# Patient Record
Sex: Female | Born: 1970 | Race: White | Hispanic: No | Marital: Married | Smoking: Never smoker
Health system: Southern US, Community
[De-identification: ages and names within clinical notes are randomized; demographics above are authoritative.]

## PROBLEM LIST (undated history)

## (undated) DIAGNOSIS — Z862 Personal history of diseases of the blood and blood-forming organs and certain disorders involving the immune mechanism: Secondary | ICD-10-CM

## (undated) DIAGNOSIS — N200 Calculus of kidney: Secondary | ICD-10-CM

## (undated) DIAGNOSIS — I319 Disease of pericardium, unspecified: Secondary | ICD-10-CM

---

## 2021-11-25 ENCOUNTER — Emergency Department (HOSPITAL_BASED_OUTPATIENT_CLINIC_OR_DEPARTMENT_OTHER): Payer: No Typology Code available for payment source

## 2021-11-25 ENCOUNTER — Other Ambulatory Visit: Payer: Self-pay

## 2021-11-25 ENCOUNTER — Emergency Department (HOSPITAL_BASED_OUTPATIENT_CLINIC_OR_DEPARTMENT_OTHER)
Admission: EM | Admit: 2021-11-25 | Discharge: 2021-11-25 | Disposition: A | Discharge status: Home / Self Care | Payer: No Typology Code available for payment source | Attending: Emergency Medicine | Admitting: Emergency Medicine

## 2021-11-25 ENCOUNTER — Encounter (HOSPITAL_BASED_OUTPATIENT_CLINIC_OR_DEPARTMENT_OTHER): Payer: Self-pay | Admitting: Emergency Medicine

## 2021-11-25 DIAGNOSIS — R197 Diarrhea, unspecified: Secondary | ICD-10-CM | POA: Insufficient documentation

## 2021-11-25 DIAGNOSIS — R109 Unspecified abdominal pain: Secondary | ICD-10-CM | POA: Diagnosis present

## 2021-11-25 DIAGNOSIS — R079 Chest pain, unspecified: Secondary | ICD-10-CM | POA: Insufficient documentation

## 2021-11-25 DIAGNOSIS — N390 Urinary tract infection, site not specified: Secondary | ICD-10-CM | POA: Insufficient documentation

## 2021-11-25 DIAGNOSIS — K529 Noninfective gastroenteritis and colitis, unspecified: Secondary | ICD-10-CM

## 2021-11-25 DIAGNOSIS — R072 Precordial pain: Secondary | ICD-10-CM

## 2021-11-25 HISTORY — DX: Disease of pericardium, unspecified: I31.9

## 2021-11-25 HISTORY — DX: Calculus of kidney: N20.0

## 2021-11-25 HISTORY — DX: Personal history of diseases of the blood and blood-forming organs and certain disorders involving the immune mechanism: Z86.2

## 2021-11-25 LAB — CBC WITH DIFFERENTIAL/PLATELET
Abs Immature Granulocytes: 0.02 10*3/uL (ref 0.00–0.07)
Basophils Absolute: 0 10*3/uL (ref 0.0–0.1)
Basophils Relative: 0 %
Eosinophils Absolute: 0 10*3/uL (ref 0.0–0.5)
Eosinophils Relative: 0 %
HCT: 39.5 % (ref 36.0–46.0)
Hemoglobin: 13.2 g/dL (ref 12.0–15.0)
Immature Granulocytes: 0 %
Lymphocytes Relative: 12 %
Lymphs Abs: 1.1 10*3/uL (ref 0.7–4.0)
MCH: 29.3 pg (ref 26.0–34.0)
MCHC: 33.4 g/dL (ref 30.0–36.0)
MCV: 87.6 fL (ref 80.0–100.0)
Monocytes Absolute: 0.7 10*3/uL (ref 0.1–1.0)
Monocytes Relative: 8 %
Neutro Abs: 6.8 10*3/uL (ref 1.7–7.7)
Neutrophils Relative %: 80 %
Platelets: 130 10*3/uL — ABNORMAL LOW (ref 150–400)
RBC: 4.51 MIL/uL (ref 3.87–5.11)
RDW: 12.9 % (ref 11.5–15.5)
WBC: 8.6 10*3/uL (ref 4.0–10.5)
nRBC: 0 % (ref 0.0–0.2)

## 2021-11-25 LAB — COMPREHENSIVE METABOLIC PANEL
ALT: 18 U/L (ref 0–44)
AST: 16 U/L (ref 15–41)
Albumin: 4.3 g/dL (ref 3.5–5.0)
Alkaline Phosphatase: 81 U/L (ref 38–126)
Anion gap: 12 (ref 5–15)
BUN: 6 mg/dL (ref 6–20)
CO2: 22 mmol/L (ref 22–32)
Calcium: 9.5 mg/dL (ref 8.9–10.3)
Chloride: 102 mmol/L (ref 98–111)
Creatinine, Ser: 0.79 mg/dL (ref 0.44–1.00)
GFR, Estimated: >60 mL/min (ref >60)
Glucose, Bld: 124 mg/dL — ABNORMAL HIGH (ref 70–99)
Potassium: 3.9 mmol/L (ref 3.5–5.1)
Sodium: 136 mmol/L (ref 135–145)
Total Bilirubin: 0.4 mg/dL (ref 0.3–1.2)
Total Protein: 7.5 g/dL (ref 6.5–8.1)

## 2021-11-25 LAB — URINALYSIS, ROUTINE W REFLEX MICROSCOPIC
Bilirubin Urine: NEGATIVE
Glucose, UA: NEGATIVE mg/dL
Hgb urine dipstick: NEGATIVE
Ketones, ur: NEGATIVE mg/dL
Leukocytes,Ua: NEGATIVE
Nitrite: NEGATIVE
Protein, ur: 30 mg/dL — AB
Specific Gravity, Urine: 1.028 (ref 1.005–1.030)
pH: 5.5 (ref 5.0–8.0)

## 2021-11-25 LAB — PREGNANCY, URINE: Preg Test, Ur: NEGATIVE

## 2021-11-25 LAB — TROPONIN I (HIGH SENSITIVITY): Troponin I (High Sensitivity): 3 ng/L (ref <18)

## 2021-11-25 LAB — LIPASE, BLOOD: Lipase: <10 U/L — ABNORMAL LOW (ref 11–51)

## 2021-11-25 MED ORDER — SODIUM CHLORIDE 0.9 % IV BOLUS
500.0000 mL | Freq: Once | INTRAVENOUS | Status: AC
Start: 2021-11-25 — End: 2021-11-25
  Administered 2021-11-25: 500 mL via INTRAVENOUS

## 2021-11-25 MED ORDER — SODIUM CHLORIDE 0.9 % IV SOLN
1.0000 g | Freq: Once | INTRAVENOUS | Status: AC
  Administered 2021-11-25: 1 g via INTRAVENOUS
  Filled 2021-11-25: qty 10

## 2021-11-25 MED ORDER — DICYCLOMINE HCL 20 MG PO TABS: 20.0000 mg | ORAL_TABLET | Freq: Two times a day (BID) | ORAL | 0 refills | Status: AC

## 2021-11-25 MED ORDER — FENTANYL CITRATE PF 50 MCG/ML IJ SOSY
50.0000 ug | PREFILLED_SYRINGE | Freq: Once | INTRAMUSCULAR | Status: AC
  Administered 2021-11-25: 50 ug via INTRAVENOUS
  Filled 2021-11-25: qty 1

## 2021-11-25 MED ORDER — AMOXICILLIN-POT CLAVULANATE 875-125 MG PO TABS: 1.0000 | ORAL_TABLET | Freq: Two times a day (BID) | ORAL | 0 refills | Status: DC

## 2021-11-25 MED ORDER — IOHEXOL 350 MG/ML SOLN
100.0000 mL | Freq: Once | INTRAVENOUS | Status: AC | PRN
  Administered 2021-11-25: 100 mL via INTRAVENOUS

## 2021-11-25 MED ORDER — ALUM & MAG HYDROXIDE-SIMETH 200-200-20 MG/5ML PO SUSP
30.0000 mL | Freq: Once | ORAL | Status: AC
  Administered 2021-11-25: 30 mL via ORAL
  Filled 2021-11-25: qty 30

## 2021-11-25 MED ORDER — METRONIDAZOLE 500 MG PO TABS: 500.0000 mg | ORAL_TABLET | Freq: Three times a day (TID) | ORAL | 0 refills | Status: AC

## 2021-11-25 MED ORDER — CEPHALEXIN 500 MG PO CAPS: 500.0000 mg | ORAL_CAPSULE | Freq: Four times a day (QID) | ORAL | 0 refills | Status: DC

## 2021-11-25 MED ORDER — ONDANSETRON HCL 4 MG/2ML IJ SOLN
4.0000 mg | Freq: Once | INTRAMUSCULAR | Status: AC
  Administered 2021-11-25: 4 mg via INTRAVENOUS
  Filled 2021-11-25: qty 2

## 2021-11-25 MED ORDER — KETOROLAC TROMETHAMINE 30 MG/ML IJ SOLN
30.0000 mg | Freq: Once | INTRAMUSCULAR | Status: AC
  Administered 2021-11-25: 30 mg via INTRAVENOUS
  Filled 2021-11-25: qty 1

## 2021-11-25 NOTE — ED Notes (Signed)
Pts medical history and allergies updated via pt questionnaire. EDP Palumbo informed.

## 2021-11-25 NOTE — ED Triage Notes (Signed)
Pt c/o generalized chest pain, generalized abdominal pain, and lower back pain ongoing for 2 days. Initially nauseous was not feeling well, increased urinary frequency, and now hurting all over. States "I cant tell where it is." Endorses pain all over.

## 2021-11-25 NOTE — ED Provider Notes (Signed)
[facility] EMERGENCY DEPT Provider Note   CSN: [id number] Arrival date & time: 11/25/21  3903     History  Chief Complaint  Patient presents with   Chest Pain   Abdominal Pain    Marilyn Garcia is a 51 y.o. female.  The history is provided by the patient.  Chest Pain Chest pain location: generalized. Pain quality: dull   Radiates to: also has abdominal pain and low back pain and diarrhea. Pain severity:  Moderate Onset quality:  Gradual Duration:  2 days Timing:  Constant Progression:  Unchanged Relieved by:  Nothing Worsened by:  Nothing Ineffective treatments: a dose of dayquil. Associated symptoms: abdominal pain and nausea   Associated symptoms: no fever, no shortness of breath and no vomiting   Abdominal pain:    Location:  Generalized   Severity:  Severe   Duration:  2 days   Timing:  Constant   Progression:  Unchanged   Chronicity:  New Risk factors: not female and no surgery   Abdominal Pain Associated symptoms: chest pain and nausea   Associated symptoms: no fever, no shortness of breath and no vomiting   Patient with all over chest and abdominal pain and low back pain.  "I can't tell where it is".       Home Medications Prior to Admission medications   Not on File      Allergies    Patient has no allergy information on record.    Review of Systems   Review of Systems  Constitutional:  Negative for fever.  HENT:  Negative for facial swelling.   Respiratory:  Negative for shortness of breath, wheezing and stridor.   Cardiovascular:  Positive for chest pain.  Gastrointestinal:  Positive for abdominal pain and nausea. Negative for vomiting.  Genitourinary:  Positive for frequency.  All other systems reviewed and are negative.   Physical Exam Updated Vital Signs BP 123/78   Pulse 80   Temp 99 F (37.2 C) (Oral)   Resp 15   Ht 5\' 5"  (1.651 m)   Wt 97.5 kg   SpO2 95%   BMI 35.78 kg/m  Physical Exam Vitals and nursing  note reviewed. Exam conducted with a chaperone present.  Constitutional:      General: She is not in acute distress.    Appearance: Normal appearance. She is well-developed. She is not diaphoretic.  HENT:     Head: Normocephalic and atraumatic.     Nose: Nose normal.     Mouth/Throat:     Mouth: Mucous membranes are moist.  Eyes:     Pupils: Pupils are equal, round, and reactive to light.  Cardiovascular:     Rate and Rhythm: Normal rate and regular rhythm.     Pulses: Normal pulses.     Heart sounds: Normal heart sounds.  Pulmonary:     Effort: Pulmonary effort is normal. No respiratory distress.     Breath sounds: Normal breath sounds.  Abdominal:     General: There is no distension.     Palpations: Abdomen is soft.     Tenderness: There is no abdominal tenderness. There is no guarding or rebound.     Comments: Gassy throughout   Genitourinary:    Vagina: No vaginal discharge.  Musculoskeletal:        General: Normal range of motion.     Cervical back: Neck supple.  Skin:    General: Skin is warm and dry.     Capillary Refill: Capillary  refill takes less than 2 seconds.     Findings: No erythema or rash.  Neurological:     General: No focal deficit present.     Mental Status: She is alert and oriented to person, place, and time.     Deep Tendon Reflexes: Reflexes normal.  Psychiatric:        Mood and Affect: Mood normal.        Behavior: Behavior normal.     ED Results / Procedures / Treatments   Labs (all labs ordered are listed, but only abnormal results are displayed) Results for orders placed or performed during the hospital encounter of 11/25/21  CBC with Differential  Result Value Ref Range   WBC 8.6 4.0 - 10.5 K/uL   RBC 4.51 3.87 - 5.11 MIL/uL   Hemoglobin 13.2 12.0 - 15.0 g/dL   HCT 32.9 51.8 - 84.1 %   MCV 87.6 80.0 - 100.0 fL   MCH 29.3 26.0 - 34.0 pg   MCHC 33.4 30.0 - 36.0 g/dL   RDW 66.0 63.0 - 16.0 %   Platelets 130 (L) 150 - 400 K/uL   nRBC  0.0 0.0 - 0.2 %   Neutrophils Relative % 80 %   Neutro Abs 6.8 1.7 - 7.7 K/uL   Lymphocytes Relative 12 %   Lymphs Abs 1.1 0.7 - 4.0 K/uL   Monocytes Relative 8 %   Monocytes Absolute 0.7 0.1 - 1.0 K/uL   Eosinophils Relative 0 %   Eosinophils Absolute 0.0 0.0 - 0.5 K/uL   Basophils Relative 0 %   Basophils Absolute 0.0 0.0 - 0.1 K/uL   Immature Granulocytes 0 %   Abs Immature Granulocytes 0.02 0.00 - 0.07 K/uL  Pregnancy, urine  Result Value Ref Range   Preg Test, Ur NEGATIVE NEGATIVE  Comprehensive metabolic panel  Result Value Ref Range   Sodium 136 135 - 145 mmol/L   Potassium 3.9 3.5 - 5.1 mmol/L   Chloride 102 98 - 111 mmol/L   CO2 22 22 - 32 mmol/L   Glucose, Bld 124 (H) 70 - 99 mg/dL   BUN 6 6 - 20 mg/dL   Creatinine, Ser 1.09 0.44 - 1.00 mg/dL   Calcium 9.5 8.9 - 32.3 mg/dL   Total Protein 7.5 6.5 - 8.1 g/dL   Albumin 4.3 3.5 - 5.0 g/dL   AST 16 15 - 41 U/L   ALT 18 0 - 44 U/L   Alkaline Phosphatase 81 38 - 126 U/L   Total Bilirubin 0.4 0.3 - 1.2 mg/dL   GFR, Estimated >55 >73 mL/min   Anion gap 12 5 - 15  Urinalysis, Routine w reflex microscopic Urine, Clean Catch  Result Value Ref Range   Color, Urine YELLOW YELLOW   APPearance HAZY (A) CLEAR   Specific Gravity, Urine 1.028 1.005 - 1.030   pH 5.5 5.0 - 8.0   Glucose, UA NEGATIVE NEGATIVE mg/dL   Hgb urine dipstick NEGATIVE NEGATIVE   Bilirubin Urine NEGATIVE NEGATIVE   Ketones, ur NEGATIVE NEGATIVE mg/dL   Protein, ur 30 (A) NEGATIVE mg/dL   Nitrite NEGATIVE NEGATIVE   Leukocytes,Ua NEGATIVE NEGATIVE   RBC / HPF 0-5 0 - 5 RBC/hpf   WBC, UA 0-5 0 - 5 WBC/hpf   Bacteria, UA MANY (A) NONE SEEN   Squamous Epithelial / LPF 11-20 0 - 5   Mucus PRESENT    Hyaline Casts, UA PRESENT   Lipase, blood  Result Value Ref Range   Lipase <10 (L)  11 - 51 U/L  Troponin I (High Sensitivity)  Result Value Ref Range   Troponin I (High Sensitivity) 3 <18 ng/L   DG Chest Portable 1 View  Result Date:  11/25/2021 CLINICAL DATA:  Chest pain for 2 days EXAM: PORTABLE CHEST 1 VIEW COMPARISON:  None Available. FINDINGS: The heart size and mediastinal contours are within normal limits. Both lungs are clear. The visualized skeletal structures are unremarkable. IMPRESSION: No active disease. Electronically Signed   By: Alcide Clever M.D.   On: 11/25/2021 03:48     DG Chest Portable 1 View  Result Date: 11/25/2021 CLINICAL DATA:  Chest pain for 2 days EXAM: PORTABLE CHEST 1 VIEW COMPARISON:  None Available. FINDINGS: The heart size and mediastinal contours are within normal limits. Both lungs are clear. The visualized skeletal structures are unremarkable. IMPRESSION: No active disease. Electronically Signed   By: Alcide Clever M.D.   On: 11/25/2021 03:48     EKG EKG Interpretation  Date/Time:  Wednesday November 25 2021 03:29:40 EDT Ventricular Rate:  82 PR Interval:  157 QRS Duration: 102 QT Interval:  373 QTC Calculation: 436 R Axis:   25 Text Interpretation: Sinus rhythm Low voltage, precordial leads Confirmed by Nicanor Alcon, Allaina Brotzman ([id number]) on 11/25/2021 3:32:45 AM  Radiology DG Chest Portable 1 View  Result Date: 11/25/2021 CLINICAL DATA:  Chest pain for 2 days EXAM: PORTABLE CHEST 1 VIEW COMPARISON:  None Available. FINDINGS: The heart size and mediastinal contours are within normal limits. Both lungs are clear. The visualized skeletal structures are unremarkable. IMPRESSION: No active disease. Electronically Signed   By: Alcide Clever M.D.   On: 11/25/2021 03:48    Procedures Procedures    Medications Ordered in ED Medications  fentaNYL (SUBLIMAZE) injection 50 mcg (has no administration in time range)  cefTRIAXone (ROCEPHIN) 1 g in sodium chloride 0.9 % 100 mL IVPB (has no administration in time range)  sodium chloride 0.9 % bolus 500 mL (has no administration in time range)  ondansetron (ZOFRAN) injection 4 mg (4 mg Intravenous Given 11/25/21 0353)  alum & mag hydroxide-simeth (MAALOX/MYLANTA)  200-200-20 MG/5ML suspension 30 mL (30 mLs Oral Given 11/25/21 0353)     ED Course/ Medical Decision Making/ A&P                           Medical Decision Making Chest, abdomen and low back pain x 2 days with diarrhea   Problems Addressed: Urinary tract infection without hematuria, site unspecified: acute illness or injury    Details: rocephin in the ED and keflex RX for 7 days  Amount and/or Complexity of Data Reviewed Labs: ordered.    Details: All labs reviewed: urine is consistent with UTI, treating.  Normal white count 8.6, normal hemoglobin 13.2, normal platelet count. pregnancy test is negative. Lipase is normal troponin is normal.  Chemistry panel and LFTs are normal Radiology: ordered and independent interpretation performed.    Details: negative CXR by me.  No dissection by me on CTA ECG/medicine tests: ordered and independent interpretation performed. Decision-making details documented in ED Course.  Risk OTC drugs. Prescription drug management. Risk Details: No signs of sepsis on exam, or vitals or labs.  Stable for discharge with close follow up.  Given time course one troponin is sufficient to exclude ACS.  Heart score is 1 very low risk for MACE.  No dissection. Colitis and UTI.  Oral antibiotics and antispasmodics prescribed.  Will have patient follow up  with GI as an outpatient for colonoscopy as directed on CT    Final Clinical Impression(s) / ED Diagnoses Final diagnoses:  None   Return for intractable cough, coughing up blood, fevers > 100.4 unrelieved by medication, shortness of breath, intractable vomiting, chest pain, shortness of breath, weakness, numbness, changes in speech, facial asymmetry, abdominal pain, passing out, Inability to tolerate liquids or food, cough, altered mental status or any concerns. No signs of systemic illness or infection. The patient is nontoxic-appearing on exam and vital signs are within normal limits.  I have reviewed the triage  vital signs and the nursing notes. Pertinent labs & imaging results that were available during my care of the patient were reviewed by me and considered in my medical decision making (see chart for details). After history, exam, and medical workup I feel the patient has been appropriately medically screened and is safe for discharge home. Pertinent diagnoses were discussed with the patient. Patient was given return precautions.        Rx / DC Orders ED Discharge Orders     None         Terez Montee, MD 11/25/21 [phone]

## 2021-11-25 NOTE — ED Notes (Signed)
Patient transported to CT 

## 2021-11-25 NOTE — ED Notes (Signed)
Pt verbalizes understanding of discharge instructions. Opportunity for questioning and answers were provided. Pt discharged from ED to home with husband.    

## 2023-06-12 IMAGING — DX DG CHEST 1V PORT
1 series · 1 of 1 positions shown · non-contrast
Comparison: None Available.

CLINICAL DATA: Chest pain for 2 days

EXAM:
PORTABLE CHEST 1 VIEW

[chest ap]
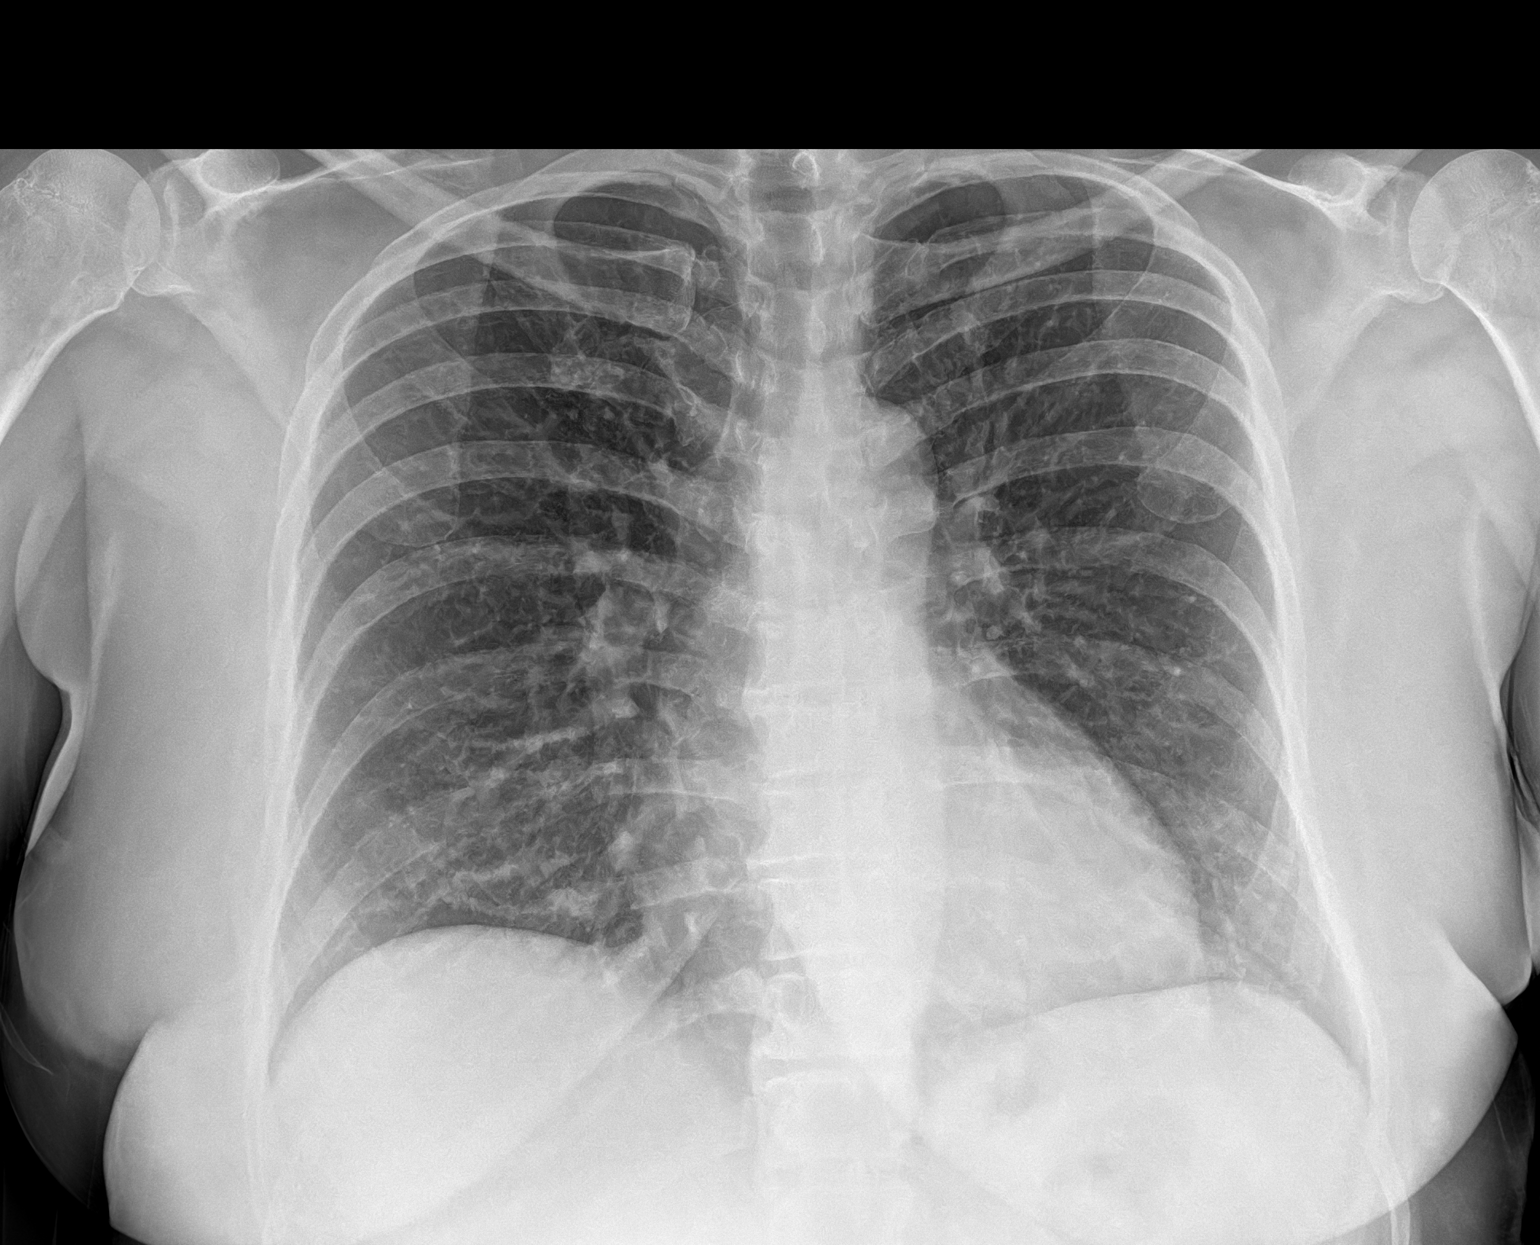

[1 of 1 positions shown; findings below may reference images not displayed]

FINDINGS: The heart size and mediastinal contours are within normal limits.
Both lungs are clear. The visualized skeletal structures are
unremarkable.
IMPRESSION: No active disease.

## 2023-06-12 IMAGING — CT CT ANGIO CHEST-ABD-PELV FOR DISSECTION W/ AND WO/W CM
2 of 7 series · 15 of 46 positions shown, 17 images · IV contrast (APPLIED)
Comparison: None Available.

CLINICAL DATA: Lower back pain with generalized abdominal pain for
2 days history of ITP and pericarditis

EXAM:
CT ANGIOGRAPHY CHEST, ABDOMEN AND PELVIS
TECHNIQUE: Non-contrast CT of the chest was initially obtained.

[Series 6: axial arterial · axial · arterial · 0.90mm/px · z∈[+811,+1423]mm · 12 of 342 slices shown, 14 images]
[im 18/342  soft-tissue]
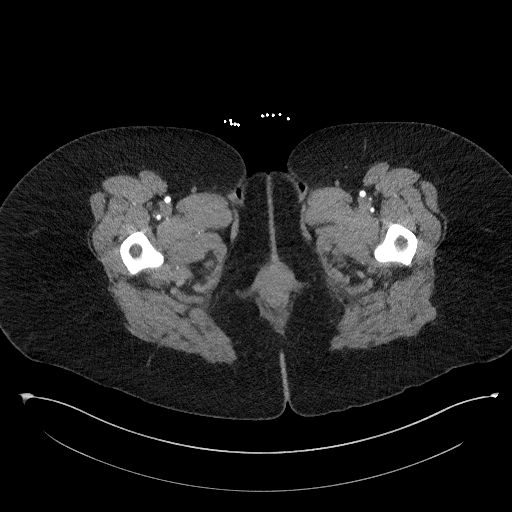
[im 18/342  bone]
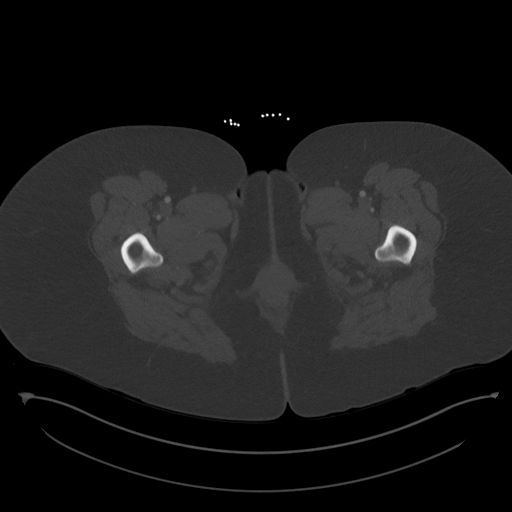
[im 54/342  soft-tissue]
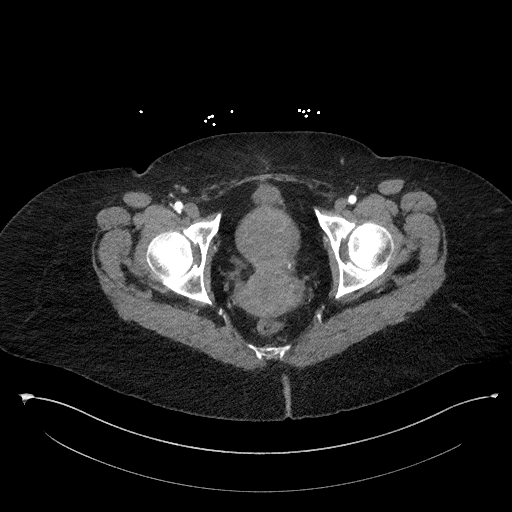
[im 72/342  soft-tissue]
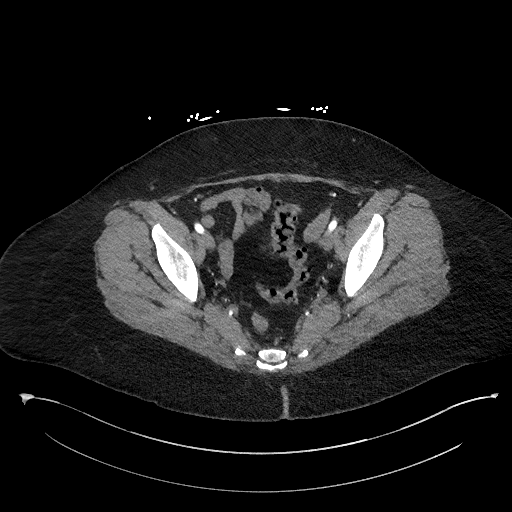
[im 108/342  soft-tissue]
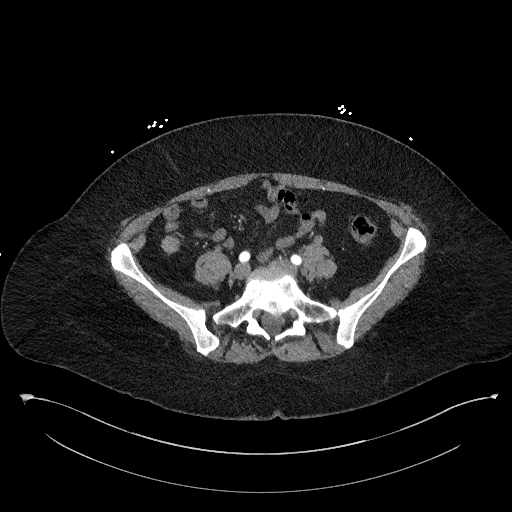
[im 126/342  soft-tissue]
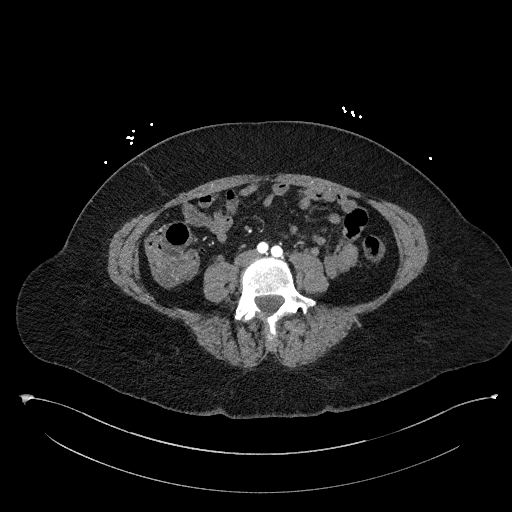
[im 162/342  soft-tissue]
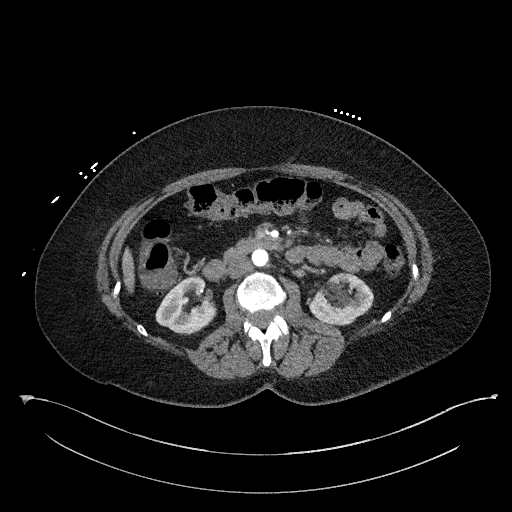
[im 180/342  soft-tissue]
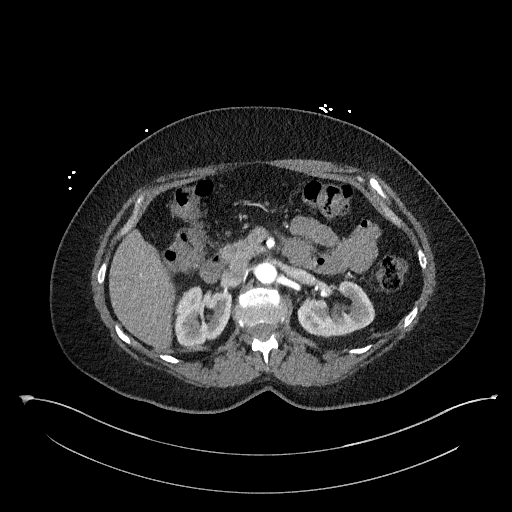
[im 216/342  soft-tissue]
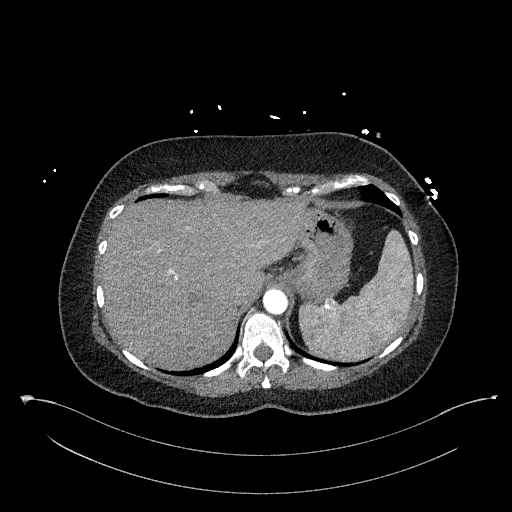
[im 234/342  soft-tissue]
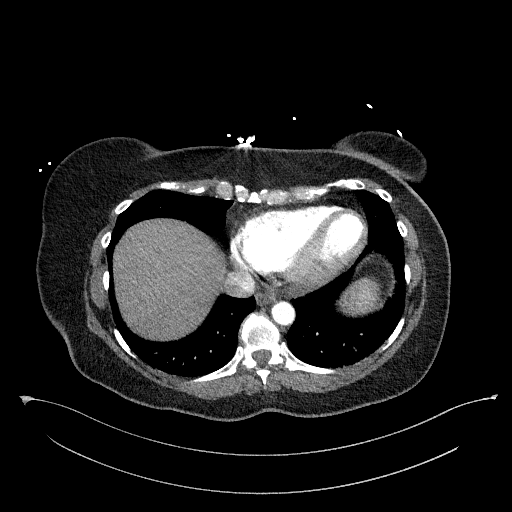
[im 234/342  bone]
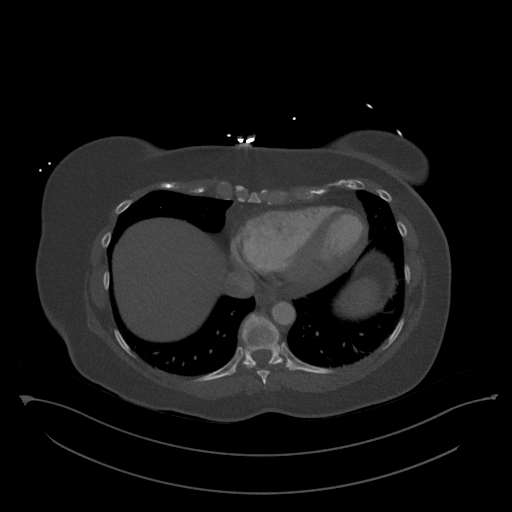
[im 270/342  soft-tissue]
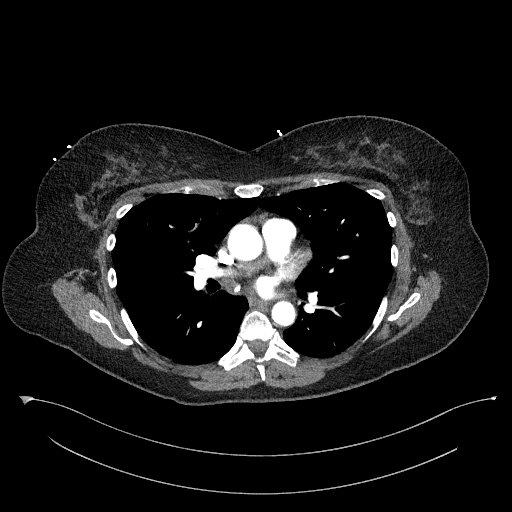
[im 288/342  soft-tissue]
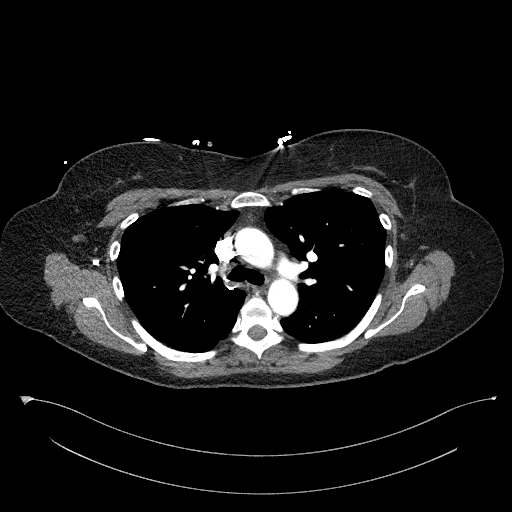
[im 324/342  soft-tissue]
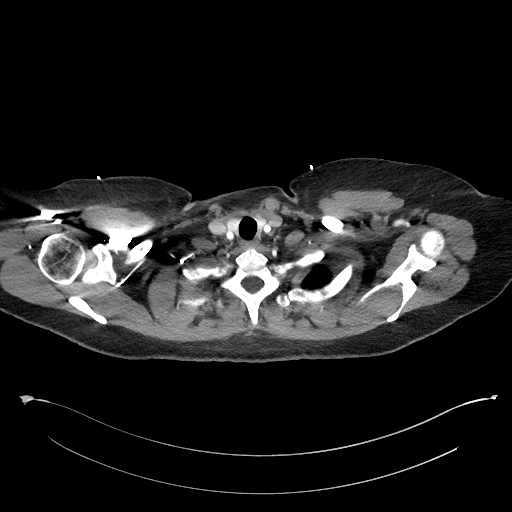

[Series 9: cor soft · coronal · 1.01mm/px · 3 of 149 slices shown]
[im 38/149  soft-tissue]
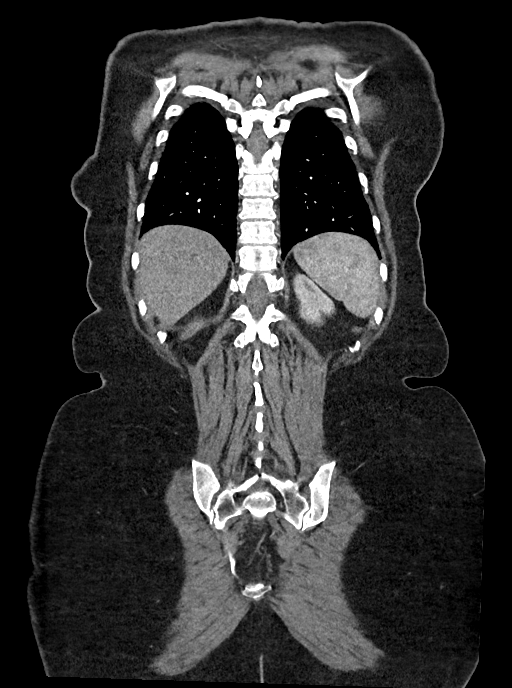
[im 75/149  soft-tissue]
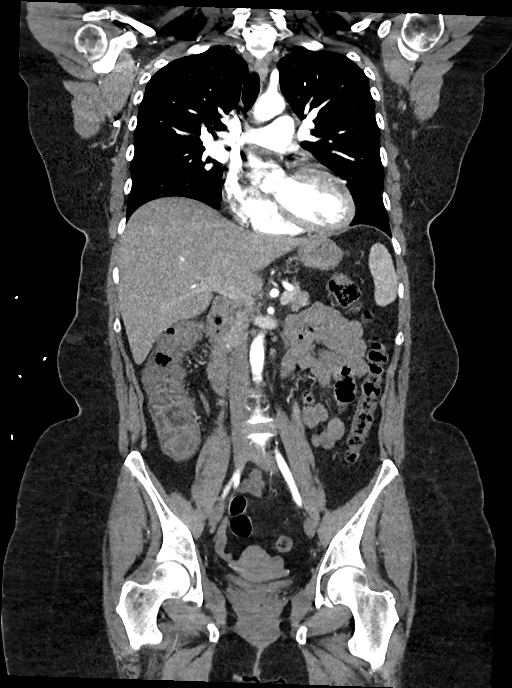
[im 112/149  soft-tissue]
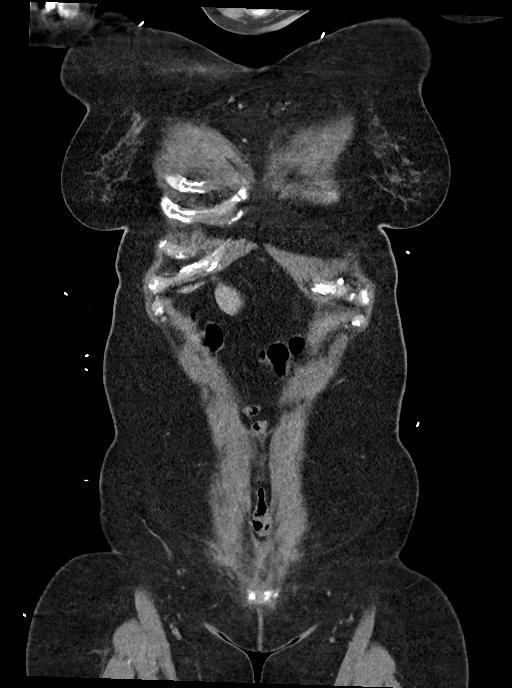

[15 of 46 positions shown; findings below may reference images not displayed]

Multidetector CT imaging through the chest, abdomen and pelvis was
performed using the standard protocol during bolus administration of
intravenous contrast. Multiplanar reconstructed images and MIPs were
obtained and reviewed to evaluate the vascular anatomy.

RADIATION DOSE REDUCTION: This exam was performed according to the
departmental dose-optimization program which includes automated
exposure control, adjustment of the mA and/or kV according to
patient size and/or use of iterative reconstruction technique.

CONTRAST:  100mL OMNIPAQUE IOHEXOL 350 MG/ML SOLN
FINDINGS: CTA CHEST FINDINGS

Cardiovascular: Preferential opacification of the thoracic aorta. No
evidence of thoracic aortic aneurysm or dissection. Normal heart
size. No significant pericardial fluid. Coronary calcification in
the left circulation.

Mediastinum/Nodes: Negative for adenopathy or mass.

Lungs/Pleura: There is no edema, consolidation, effusion, or
pneumothorax.

Musculoskeletal: No acute or aggressive finding

Review of the MIP images confirms the above findings.

CTA ABDOMEN AND PELVIS FINDINGS

VASCULAR

Aorta: Atheromatous wall thickening and occasional calcification of
the aorta. No aneurysm or dissection

Celiac: Unremarkable

SMA: Unremarkable

Renals: Unremarkable

IMA: Unremarkable

Inflow: Unremarkable

Veins: Unremarkable for the arterial phase

Review of the MIP images confirms the above findings.

NON-VASCULAR

Hepatobiliary: No focal liver abnormality.No evidence of biliary
obstruction or stone.

Pancreas: Unremarkable.

Spleen: Unremarkable.

Adrenals/Urinary Tract: Negative adrenals. No hydronephrosis or
stone. Unremarkable bladder.

Stomach/Bowel: Prominent thickness of the ascending colon with
submucosal low-density appearance on coronal reformats. There is
accentuation of ileocolic nodal size measuring up to 18 mm.

Lymphatic: Ileocolic lymphadenopathy as noted above

Reproductive:No pathologic findings.

Other: No ascites or pneumoperitoneum.

Musculoskeletal: No acute abnormalities.

Review of the MIP images confirms the above findings.
IMPRESSION: 1. Negative for acute aortic syndrome. Aortic and coronary
atheromatous calcification.
2. Thick walled appearance of the ascending colon with mildly
enlarged ileocolic lymph nodes, suspected colitis. Please ensure
patient is up-to-date with colonoscopic screening.

## 2023-10-02 ENCOUNTER — Emergency Department (HOSPITAL_BASED_OUTPATIENT_CLINIC_OR_DEPARTMENT_OTHER): Admitting: Radiology

## 2023-10-02 ENCOUNTER — Emergency Department (HOSPITAL_BASED_OUTPATIENT_CLINIC_OR_DEPARTMENT_OTHER): Admission: EM | Admit: 2023-10-02 | Discharge: 2023-10-02 | Disposition: A | Discharge status: Home / Self Care

## 2023-10-02 ENCOUNTER — Other Ambulatory Visit: Payer: Self-pay

## 2023-10-02 DIAGNOSIS — J181 Lobar pneumonia, unspecified organism: Secondary | ICD-10-CM | POA: Diagnosis not present

## 2023-10-02 DIAGNOSIS — R002 Palpitations: Secondary | ICD-10-CM | POA: Insufficient documentation

## 2023-10-02 DIAGNOSIS — R079 Chest pain, unspecified: Secondary | ICD-10-CM | POA: Diagnosis present

## 2023-10-02 DIAGNOSIS — J168 Pneumonia due to other specified infectious organisms: Secondary | ICD-10-CM | POA: Diagnosis not present

## 2023-10-02 DIAGNOSIS — J189 Pneumonia, unspecified organism: Secondary | ICD-10-CM

## 2023-10-02 LAB — CBC
HCT: 42 % (ref 36.0–46.0)
Hemoglobin: 13.8 g/dL (ref 12.0–15.0)
MCH: 29.4 pg (ref 26.0–34.0)
MCHC: 32.9 g/dL (ref 30.0–36.0)
MCV: 89.6 fL (ref 80.0–100.0)
Platelets: 168 10*3/uL (ref 150–400)
RBC: 4.69 MIL/uL (ref 3.87–5.11)
RDW: 12.4 % (ref 11.5–15.5)
WBC: 7.7 10*3/uL (ref 4.0–10.5)
nRBC: 0 % (ref 0.0–0.2)

## 2023-10-02 LAB — TROPONIN T, HIGH SENSITIVITY
Troponin T High Sensitivity: <15 ng/L (ref <19)
Troponin T High Sensitivity: <15 ng/L (ref <19)

## 2023-10-02 LAB — BASIC METABOLIC PANEL WITH GFR
Anion gap: 14 (ref 5–15)
BUN: 15 mg/dL (ref 6–20)
CO2: 22 mmol/L (ref 22–32)
Calcium: 9.8 mg/dL (ref 8.9–10.3)
Chloride: 101 mmol/L (ref 98–111)
Creatinine, Ser: 0.85 mg/dL (ref 0.44–1.00)
GFR, Estimated: >60 mL/min (ref >60)
Glucose, Bld: 96 mg/dL (ref 70–99)
Potassium: 4.3 mmol/L (ref 3.5–5.1)
Sodium: 137 mmol/L (ref 135–145)

## 2023-10-02 LAB — PREGNANCY, URINE: Preg Test, Ur: NEGATIVE

## 2023-10-02 MED ORDER — AMOXICILLIN-POT CLAVULANATE 875-125 MG PO TABS: 1.0000 | ORAL_TABLET | Freq: Two times a day (BID) | ORAL | 0 refills | Status: AC

## 2023-10-02 NOTE — ED Triage Notes (Signed)
 Pt caox4, ambulatory c/o CP intermittent for the past week and today her HR was getting elevated while doing normal activity. Pt states it feels similar to when she was dx with pericarditis a few years ago.

## 2023-10-02 NOTE — ED Provider Notes (Signed)
 [place] EMERGENCY DEPARTMENT AT [facility] Provider Note   CSN: [id number] Arrival date & time: 10/02/23  1642     History  Chief Complaint  Patient presents with   Chest Pain    Marilyn Garcia is a 53 y.o. female.  Is a 53 year old female presenting emergency department for chest pain.  Been constant, but varies in intensity for the past week.  Seemingly worse in the evenings.  Reports feels similar to prior diagnosis of pericarditis she had.  Reports some palpitations with exercise, but no lightheadedness or dizziness.  Low risk for PE based on Wells criteria.   Chest Pain      Home Medications Prior to Admission medications   Medication Sig Start Date End Date Taking? Authorizing Provider  escitalopram (LEXAPRO) 10 MG tablet Take 1 tablet by mouth daily. 08/26/17  Yes [provider]  traZODone (DESYREL) 50 MG tablet Take 50 mg by mouth. 09/15/22  Yes [provider]  amoxicillin -clavulanate (AUGMENTIN ) 875-125 MG tablet Take 1 tablet by mouth every 12 (twelve) hours. 11/25/21   Palumbo, April, MD  dicyclomine  (BENTYL ) 20 MG tablet Take 1 tablet (20 mg total) by mouth 2 (two) times daily. 11/25/21   Palumbo, April, MD  metroNIDAZOLE  (FLAGYL ) 500 MG tablet Take 1 tablet (500 mg total) by mouth 3 (three) times daily. 11/25/21   Palumbo, April, MD      Allergies    Patient has no known allergies.    Review of Systems   Review of Systems  Cardiovascular:  Positive for chest pain.    Physical Exam Updated Vital Signs BP (!) 168/80   Pulse 62   Temp 98 F (36.7 C)   Resp 16   Ht 5\' 5"  (1.651 m)   Wt 96.2 kg   SpO2 100%   BMI 35.28 kg/m  Physical Exam Vitals and nursing note reviewed.  Constitutional:      General: She is not in acute distress.    Appearance: She is not toxic-appearing.  HENT:     Head: Normocephalic.  Cardiovascular:     Rate and Rhythm: Normal rate and regular rhythm.     Heart sounds: Normal heart sounds.   Pulmonary:     Effort: Pulmonary effort is normal.     Breath sounds: Normal breath sounds.  Musculoskeletal:     Right lower leg: No edema.     Left lower leg: No edema.  Skin:    General: Skin is warm.     Capillary Refill: Capillary refill takes less than 2 seconds.  Neurological:     Mental Status: She is alert and oriented to person, place, and time.     ED Results / Procedures / Treatments   Labs (all labs ordered are listed, but only abnormal results are displayed) Labs Reviewed  BASIC METABOLIC PANEL WITH GFR  CBC  PREGNANCY, URINE  TROPONIN T, HIGH SENSITIVITY  TROPONIN T, HIGH SENSITIVITY    EKG EKG Interpretation Date/Time:  Sunday October 02 2023 16:56:34 EDT Ventricular Rate:  73 PR Interval:  152 QRS Duration:  82 QT Interval:  352 QTC Calculation: 387 R Axis:   69  Text Interpretation: Normal sinus rhythm Cannot rule out Anterior infarct , age undetermined Abnormal ECG When compared with ECG of 25-Nov-2021 03:29, PREVIOUS ECG IS PRESENT Confirmed by Elise Guile [phone]) on 10/02/2023 5:00:45 PM  Radiology DG Chest 2 View Result Date: 10/02/2023 CLINICAL DATA:  Initial evaluation for acute chest pain. EXAM: CHEST - 2  VIEW COMPARISON:  Radiograph from 11/25/2021. FINDINGS: Transverse heart size within normal limits. Mediastinal silhouette within normal limits. Lungs normally inflated. Patchy opacity at the medial right lung base, which could reflect atelectasis or infiltrate. No other focal airspace disease. No pulmonary edema or pleural effusion. No pneumothorax. Visualized soft tissues and osseous structures within normal limits. IMPRESSION: Patchy opacity at the medial right lung base, which could reflect atelectasis or infiltrate. Electronically Signed   By: Virgia Griffins M.D.   On: 10/02/2023 18:01    Procedures Procedures    Medications Ordered in ED Medications - No data to display  ED Course/ Medical Decision Making/ A&P Clinical Course as  of 10/02/23 1933  Sun Oct 02, 2023  1709 Saw primary doctor in February, per chart review is being seen for insomnia, depression and maxillary cyst under problem list.  I could not locate any cardiac testing. [TY]  1711 EKG 12-Lead EKG on my independent interpretation appears to be normal sinus rhythm at a rate of 73.  Normal intervals.  No QTc prolongation.  Appears similar to prior EKGs.  I do not appreciate ST segment changes that would indicate ischemia. [TY]  1835 DG Chest 2 View IMPRESSION: Patchy opacity at the medial right lung base, which could reflect atelectasis or infiltrate   [TY]    Clinical Course User Index [TY] Rolinda Climes, DO                                 Medical Decision Making This is a 53 year old female presenting emergency department with chest pain.  She is afebrile nontachycardic, slightly hypertensive.  Maintaining oxygen saturation on room air.  Troponins negative x 2.  EKG without ST segment changes to indicate ischemia.  I do not appreciate changes that would indicate pericarditis either.  She has no leukocytosis to suggest systemic infection.  No metabolic derangements.  Normal kidney function.  Her chest x-ray with findings concerning for pneumonia.  No indication to admit based on curb 65 criteria.  Will give prescription for antibiotics.  Patient to follow-up with primary doctor.  Amount and/or Complexity of Data Reviewed Labs: ordered. Radiology: ordered. Decision-making details documented in ED Course. ECG/medicine tests:  Decision-making details documented in ED Course.          Final Clinical Impression(s) / ED Diagnoses Final diagnoses:  None    Rx / DC Orders ED Discharge Orders     None         Rolinda Climes, DO 10/02/23 1933

## 2023-10-02 NOTE — Discharge Instructions (Addendum)
 You may take Tylenol alternating with ibuprofen for baseline pain control.  We are prescribing you antibiotics for your pneumonia.  Return if develop persistent fevers, lethargy, worsening chest pain, worsening shortness of breath, lightheadedness, passout or any new or worsening symptoms that are concerning to you.  Please follow-up with your primary doctor.

## 2023-10-09 ENCOUNTER — Other Ambulatory Visit: Payer: Self-pay

## 2023-10-09 ENCOUNTER — Emergency Department (HOSPITAL_BASED_OUTPATIENT_CLINIC_OR_DEPARTMENT_OTHER)
Admission: EM | Admit: 2023-10-09 | Discharge: 2023-10-09 | Disposition: A | Discharge status: Home / Self Care | Attending: Emergency Medicine | Admitting: Emergency Medicine

## 2023-10-09 ENCOUNTER — Encounter (HOSPITAL_BASED_OUTPATIENT_CLINIC_OR_DEPARTMENT_OTHER): Payer: Self-pay | Admitting: Emergency Medicine

## 2023-10-09 ENCOUNTER — Emergency Department (HOSPITAL_BASED_OUTPATIENT_CLINIC_OR_DEPARTMENT_OTHER): Admitting: Radiology

## 2023-10-09 DIAGNOSIS — D696 Thrombocytopenia, unspecified: Secondary | ICD-10-CM | POA: Diagnosis not present

## 2023-10-09 DIAGNOSIS — Z79899 Other long term (current) drug therapy: Secondary | ICD-10-CM | POA: Insufficient documentation

## 2023-10-09 DIAGNOSIS — R0789 Other chest pain: Secondary | ICD-10-CM | POA: Diagnosis present

## 2023-10-09 LAB — CBC
HCT: 42.4 % (ref 36.0–46.0)
Hemoglobin: 13.7 g/dL (ref 12.0–15.0)
MCH: 29.5 pg (ref 26.0–34.0)
MCHC: 32.3 g/dL (ref 30.0–36.0)
MCV: 91.4 fL (ref 80.0–100.0)
Platelets: 143 10*3/uL — ABNORMAL LOW (ref 150–400)
RBC: 4.64 MIL/uL (ref 3.87–5.11)
RDW: 12.6 % (ref 11.5–15.5)
WBC: 6.3 10*3/uL (ref 4.0–10.5)
nRBC: 0 % (ref 0.0–0.2)

## 2023-10-09 LAB — BASIC METABOLIC PANEL WITH GFR
Anion gap: 11 (ref 5–15)
BUN: 11 mg/dL (ref 6–20)
CO2: 26 mmol/L (ref 22–32)
Calcium: 9.8 mg/dL (ref 8.9–10.3)
Chloride: 102 mmol/L (ref 98–111)
Creatinine, Ser: 0.79 mg/dL (ref 0.44–1.00)
GFR, Estimated: >60 mL/min (ref >60)
Glucose, Bld: 95 mg/dL (ref 70–99)
Potassium: 4.2 mmol/L (ref 3.5–5.1)
Sodium: 139 mmol/L (ref 135–145)

## 2023-10-09 LAB — D-DIMER, QUANTITATIVE: D-Dimer, Quant: 0.27 ug{FEU}/mL (ref 0.00–0.50)

## 2023-10-09 LAB — TROPONIN T, HIGH SENSITIVITY: Troponin T High Sensitivity: <15 ng/L (ref <19)

## 2023-10-09 MED ORDER — ACETAMINOPHEN 500 MG PO TABS
1000.0000 mg | ORAL_TABLET | Freq: Once | ORAL | Status: AC
  Administered 2023-10-09: 1000 mg via ORAL
  Filled 2023-10-09: qty 2

## 2023-10-09 MED ORDER — KETOROLAC TROMETHAMINE 15 MG/ML IJ SOLN
15.0000 mg | Freq: Once | INTRAMUSCULAR | Status: DC
  Filled 2023-10-09: qty 1

## 2023-10-09 MED ORDER — KETOROLAC TROMETHAMINE 15 MG/ML IJ SOLN
15.0000 mg | Freq: Once | INTRAMUSCULAR | Status: AC
  Administered 2023-10-09: 15 mg via INTRAMUSCULAR

## 2023-10-09 NOTE — ED Triage Notes (Signed)
 Dx pneumonia last week, started antibiotic , her md added a zpack. Still having chest pain and shoulder blades.

## 2023-10-09 NOTE — ED Provider Notes (Signed)
 [place] [facility]PARTMENT AT [facility] Provider Note   CSN: [id number] Arrival date & time: 10/09/23  1244     History  No chief complaint on file.   Marilyn Garcia is a 53 y.o. female with PMH as listed below who presents with CP. Dx pneumonia last week, started antibiotic , her md added a zpack. Still having chest pain and shoulder blades. Sharp/burning. Rated 6/10, not exertional. +pleuritic. Felt like prior episode of pericarditis.  No SOB, coughing, fevers/chills, flu-like sxs. Never had any fevers/chills/cough, even when she got diagnosed w/ pneumonia on the XR. Took the abx. Her CP got better for maybe one day but came right back. Recently diagnosed w/ CAD and HLD from a parotic CT scan, and saw her PCP on Wednesday to start meds for that. Did recently drive to [place]  last month. No leg swelling, h/o DVT/PE, recent hospitalizations/immobilizations. No hormones, tobacco, or blood thinners.  .    Past Medical History:  Diagnosis Date   History of ITP    1992   Kidney stones    Pericarditis    2021       Home Medications Prior to Admission medications   Medication Sig Start Date End Date Taking? Authorizing Provider  amoxicillin -clavulanate (AUGMENTIN ) 875-125 MG tablet Take 1 tablet by mouth every 12 (twelve) hours. 10/02/23   Rolinda Climes, DO  dicyclomine  (BENTYL ) 20 MG tablet Take 1 tablet (20 mg total) by mouth 2 (two) times daily. 11/25/21   Palumbo, April, MD  escitalopram (LEXAPRO) 10 MG tablet Take 1 tablet by mouth daily. 08/26/17   [provider]  metroNIDAZOLE  (FLAGYL ) 500 MG tablet Take 1 tablet (500 mg total) by mouth 3 (three) times daily. 11/25/21   Palumbo, April, MD  traZODone (DESYREL) 50 MG tablet Take 50 mg by mouth. 09/15/22   [provider]      Allergies    Patient has no known allergies.    Review of Systems   Review of Systems A 10 point review of systems was performed and is negative unless otherwise  reported in HPI.  Physical Exam Updated Vital Signs BP (!) 139/99 (BP Location: Right Arm)   Pulse 67   Temp 98 F (36.7 C)   Resp 16   Ht 5\' 5"  (1.651 m)   Wt 96.2 kg   SpO2 98%   BMI 35.28 kg/m  Physical Exam General: Normal appearing female, lying in bed.  HEENT: PERRLA, Sclera anicteric, MMM, trachea midline.  Cardiology: RRR, no murmurs/rubs/gallops. BL radial and DP pulses equal bilaterally.  Resp: Normal respiratory rate and effort. CTAB, no wheezes, rhonchi, crackles.  Abd: Soft, non-tender, non-distended. No rebound tenderness or guarding.  GU: Deferred. MSK: No peripheral edema or signs of trauma. Extremities without deformity or TTP. No cyanosis or clubbing. Skin: warm, dry. Back: No CVA tenderness Neuro: A&Ox4, CNs II-XII grossly intact. MAEs. Sensation grossly intact.  Psych: Normal mood and affect.   ED Results / Procedures / Treatments   Labs (all labs ordered are listed, but only abnormal results are displayed) Labs Reviewed  CBC - Abnormal; Notable for the following components:      Result Value   Platelets 143 (*)    All other components within normal limits  BASIC METABOLIC PANEL WITH GFR  D-DIMER, QUANTITATIVE  TROPONIN T, HIGH SENSITIVITY  TROPONIN T, HIGH SENSITIVITY    EKG None  Radiology DG Chest 2 View Result Date: 10/09/2023 CLINICAL DATA:  cp EXAM: CHEST - 2  VIEW COMPARISON:  10/02/2023 FINDINGS: Cardiac silhouette is unremarkable. No pneumothorax or pleural effusion. The lungs are clear. The visualized skeletal structures are unremarkable. IMPRESSION: No acute cardiopulmonary process. Electronically Signed   By: Sydell Eva M.D.   On: 10/09/2023 13:56    Procedures Ultrasound ED Echo  Date/Time: 10/09/2023 4:42 PM  Performed by: Merdis Stalling, MD Authorized by: Merdis Stalling, MD   Procedure details:    Indications: chest pain     Views: subxiphoid, parasternal long axis view and parasternal short axis view     Images:  archived     Limitations:  Body habitus Findings:    Pericardium: no pericardial effusion     LV Function: normal (>50% EF)     RV Diameter: normal   Impression:    Impression: normal   Ultrasound ED Abd  Date/Time: 10/09/2023 4:43 PM  Performed by: Merdis Stalling, MD Authorized by: Merdis Stalling, MD   Procedure details:    Aorta:  Visualized    Images: archived   Study Limitations: body habitus Vascular findings:    Aorta: aorta normal (< 3cm)       Medications Ordered in ED Medications  acetaminophen (TYLENOL) tablet 1,000 mg (has no administration in time range)  ketorolac  (TORADOL ) 15 MG/ML injection 15 mg (has no administration in time range)    ED Course/ Medical Decision Making/ A&P                          Medical Decision Making Amount and/or Complexity of Data Reviewed Labs: ordered. Decision-making details documented in ED Course. Radiology:  Decision-making details documented in ED Course.  Risk OTC drugs. Prescription drug management.    This patient presents to the ED for concern of CP, this involves an extensive number of treatment options, and is a complaint that carries with it a high risk of complications and morbidity.  I considered the following differential and admission for this acute, potentially life threatening condition.   MDM:    DDX for chest pain includes but is not limited to:  Very low suspicion for ACS/arrhythmia given presenting sx. EKG w/o ischemia or ischemia, no signs pericarditis, Trop neg. Patient cannot PERC out based on age, but D dimer is neg, minimal risk factors for PE. No abdominal pain and no c/f biliary disease. BSUS shows no pericardial effusion, grossly normal EF, and no widening of thoracic aortic root, no abdominal aortic aneurysm or dissection. States it feels different than reflux, though could consider that. Higher concern for musculoskeletal pain, recommended tylenol/ibuprofen, but patient also recently diagnosed  w/ CAD. Will refer to cardiology. She is very well-appearing now, HDS, no emergent process right now.    Clinical Course as of 10/09/23 1646  Sun Oct 09, 2023  1517 BMP, CBC with no acute findings. Chronic mild thrombocytopenia. [HN]  1517 Troponin T High Sensitivity: <15 neg [HN]  1518 DG Chest 2 View No acute cardiopulmonary process. [HN]  1545 D-Dimer, Quant: 0.27 Dimer neg [HN]    Clinical Course User Index [HN] Merdis Stalling, MD    Labs: I Ordered, and personally interpreted labs.  The pertinent results include:  those listed above  Imaging Studies ordered: I ordered imaging studies including CXR I independently visualized and interpreted imaging. I agree with the radiologist interpretation  Additional history obtained from chart review.   Cardiac Monitoring: The patient was maintained on a cardiac monitor.  I personally viewed and  interpreted the cardiac monitored which showed an underlying rhythm of: NSR  Reevaluation: After the interventions noted above, I reevaluated the patient and found that they have :stayed the same  Social Determinants of Health: Lives independently  Disposition:  DC w/ discharge instructions/return precautions. All questions answered to patient's satisfaction.  Referred to cardiology to f/u within 1 week.  Co morbidities that complicate the patient evaluation  Past Medical History:  Diagnosis Date   History of ITP    1992   Kidney stones    Pericarditis    2021     Medicines Meds ordered this encounter  Medications   acetaminophen (TYLENOL) tablet 1,000 mg   DISCONTD: ketorolac  (TORADOL ) 15 MG/ML injection 15 mg   ketorolac  (TORADOL ) 15 MG/ML injection 15 mg    I have reviewed the patients home medicines and have made adjustments as needed  Problem List / ED Course: Problem List Items Addressed This Visit   None Visit Diagnoses       Atypical chest pain    -  Primary   Relevant Orders   Ambulatory referral to  Cardiology                   This note was created using dictation software, which may contain spelling or grammatical errors.    Merdis Stalling, MD 10/09/23 [phone]

## 2023-10-09 NOTE — ED Notes (Signed)
 Spoke with lab, will add ddimer to previously ordered labs

## 2023-10-09 NOTE — Discharge Instructions (Signed)
 Thank you for coming to [facility] Emergency Department. You were seen for chest pain. We did an exam, labs, and imaging, and these showed no acute findings. You can alternate taking Tylenol and ibuprofen as needed for pain. You can take 650mg  tylenol (acetaminophen) every 4-6 hours, and 600 mg ibuprofen 3 times a day. Please follow up with cardiology within 1 week. We have placed a referral, and they should call you to make an appointment.   Do not hesitate to return to the ED or call 911 if you experience: -Worsening symptoms -Lightheadedness, passing out -Fevers/chills -Anything else that concerns you

## 2024-02-08 ENCOUNTER — Ambulatory Visit (HOSPITAL_BASED_OUTPATIENT_CLINIC_OR_DEPARTMENT_OTHER): Admitting: Cardiology
